# Patient Record
Sex: Male | Born: 1955 | Race: White | Hispanic: No | Smoking: Never smoker
Health system: Southern US, Community
[De-identification: ages and names within clinical notes are randomized; demographics above are authoritative.]

## PROBLEM LIST (undated history)

## (undated) DIAGNOSIS — D763 Other histiocytosis syndromes: Secondary | ICD-10-CM

## (undated) DIAGNOSIS — H3560 Retinal hemorrhage, unspecified eye: Secondary | ICD-10-CM

## (undated) DIAGNOSIS — R911 Solitary pulmonary nodule: Secondary | ICD-10-CM

## (undated) DIAGNOSIS — I10 Essential (primary) hypertension: Secondary | ICD-10-CM

## (undated) DIAGNOSIS — D4989 Neoplasm of unspecified behavior of other specified sites: Secondary | ICD-10-CM

## (undated) DIAGNOSIS — I2510 Atherosclerotic heart disease of native coronary artery without angina pectoris: Secondary | ICD-10-CM

## (undated) HISTORY — PX: HAND SURGERY: SHX662

## (undated) HISTORY — PX: HERNIA REPAIR: SHX51

---

## 2010-02-01 ENCOUNTER — Encounter: Payer: Self-pay | Admitting: Cardiology

## 2010-03-07 ENCOUNTER — Ambulatory Visit: Payer: Self-pay | Admitting: Cardiology

## 2010-03-07 DIAGNOSIS — I10 Essential (primary) hypertension: Secondary | ICD-10-CM | POA: Insufficient documentation

## 2010-03-07 DIAGNOSIS — R0989 Other specified symptoms and signs involving the circulatory and respiratory systems: Secondary | ICD-10-CM

## 2010-03-07 DIAGNOSIS — R0609 Other forms of dyspnea: Secondary | ICD-10-CM

## 2010-04-02 ENCOUNTER — Ambulatory Visit: Payer: Self-pay | Admitting: Cardiology

## 2010-04-05 ENCOUNTER — Ambulatory Visit: Payer: Self-pay

## 2010-04-05 ENCOUNTER — Ambulatory Visit (HOSPITAL_COMMUNITY): Admission: RE | Admit: 2010-04-05 | Discharge: 2010-04-05 | Payer: Self-pay | Admitting: Cardiology

## 2010-04-05 ENCOUNTER — Encounter: Payer: Self-pay | Admitting: Cardiology

## 2010-04-05 ENCOUNTER — Emergency Department (HOSPITAL_COMMUNITY): Admission: EM | Admit: 2010-04-05 | Discharge: 2010-04-05 | Payer: Self-pay | Admitting: Emergency Medicine

## 2010-04-05 ENCOUNTER — Ambulatory Visit: Payer: Self-pay | Admitting: Internal Medicine

## 2010-04-09 ENCOUNTER — Ambulatory Visit: Payer: Self-pay | Admitting: Cardiology

## 2010-04-09 DIAGNOSIS — R9430 Abnormal result of cardiovascular function study, unspecified: Secondary | ICD-10-CM | POA: Insufficient documentation

## 2010-04-09 LAB — CONVERTED CEMR LAB
Creatinine, Ser: 0.9 mg/dL (ref 0.4–1.5)
GFR calc non Af Amer: 93.02 mL/min (ref >60)
Glucose, Bld: 86 mg/dL (ref 70–99)

## 2010-04-10 ENCOUNTER — Encounter (INDEPENDENT_AMBULATORY_CARE_PROVIDER_SITE_OTHER): Payer: Self-pay | Admitting: *Deleted

## 2010-04-12 LAB — CONVERTED CEMR LAB
Ferritin: 79 ng/mL (ref 22–322)
Iron: 75 ug/dL (ref 42–165)

## 2010-04-15 LAB — CONVERTED CEMR LAB
BUN: 15 mg/dL (ref 6–23)
Calcium: 8.9 mg/dL (ref 8.4–10.5)
Chloride: 109 meq/L (ref 96–112)
Creatinine, Ser: 0.7 mg/dL (ref 0.4–1.5)
Eosinophils Relative: 0.8 % (ref 0.0–5.0)
GFR calc non Af Amer: 118.08 mL/min (ref >60)
Glucose, Bld: 87 mg/dL (ref 70–99)
Lymphocytes Relative: 19.9 % (ref 12.0–46.0)
Lymphs Abs: 1.5 10*3/uL (ref 0.7–4.0)
MCHC: 34.8 g/dL (ref 30.0–36.0)
MCV: 88.8 fL (ref 78.0–100.0)
Monocytes Absolute: 0.5 10*3/uL (ref 0.1–1.0)
Monocytes Relative: 6.5 % (ref 3.0–12.0)
Neutro Abs: 5.6 10*3/uL (ref 1.4–7.7)
Neutrophils Relative %: 72.3 % (ref 43.0–77.0)
Platelets: 170 10*3/uL (ref 150.0–400.0)
Sodium: 141 meq/L (ref 135–145)
WBC: 7.7 10*3/uL (ref 4.5–10.5)

## 2010-04-16 ENCOUNTER — Ambulatory Visit (HOSPITAL_COMMUNITY): Admission: RE | Admit: 2010-04-16 | Discharge: 2010-04-16 | Payer: Self-pay | Admitting: Cardiology

## 2010-04-16 ENCOUNTER — Ambulatory Visit: Payer: Self-pay | Admitting: Cardiology

## 2010-04-16 ENCOUNTER — Telehealth: Payer: Self-pay | Admitting: Cardiology

## 2010-04-17 ENCOUNTER — Ambulatory Visit: Payer: Self-pay | Admitting: Cardiology

## 2010-04-17 ENCOUNTER — Ambulatory Visit (HOSPITAL_COMMUNITY): Admission: RE | Admit: 2010-04-17 | Discharge: 2010-04-17 | Payer: Self-pay | Admitting: Cardiology

## 2010-04-19 ENCOUNTER — Telehealth: Payer: Self-pay | Admitting: Cardiology

## 2010-04-23 DIAGNOSIS — J984 Other disorders of lung: Secondary | ICD-10-CM

## 2010-04-25 ENCOUNTER — Telehealth: Payer: Self-pay | Admitting: Cardiology

## 2010-04-25 ENCOUNTER — Ambulatory Visit: Payer: Self-pay | Admitting: Internal Medicine

## 2010-04-26 ENCOUNTER — Encounter (INDEPENDENT_AMBULATORY_CARE_PROVIDER_SITE_OTHER): Payer: Self-pay | Admitting: *Deleted

## 2010-04-29 ENCOUNTER — Telehealth: Payer: Self-pay | Admitting: Cardiology

## 2010-05-01 ENCOUNTER — Telehealth: Payer: Self-pay | Admitting: Cardiology

## 2010-05-13 ENCOUNTER — Telehealth: Payer: Self-pay | Admitting: Cardiology

## 2010-11-05 NOTE — Progress Notes (Signed)
Summary: followup for pulmonary nodule  ---- Converted from flag ---- ---- 04/26/2010 4:59 PM, Merita Norton Lloyd-Fate wrote: Dr. Arbutus Ped referred Mr. Richard Knox to Dr. Solon Augusta for Pulmonary work up on 05-10-10 @ 1:50pm. After his appt. with Dr. Solon Augusta, his office will call him with an appt. pt aware. Merita Norton Lloyd-Fate  April 26, 2010 4:58 PM ------------------------------

## 2010-11-05 NOTE — Progress Notes (Signed)
Summary: pt has  medication question  Phone Note Call from Patient Call back at Home Phone [phone]   Caller: Patient Reason for Call: Talk to Nurse, Talk to Doctor Summary of Call: pt needs to know when to take his metoprolol prior to MRI testing Initial call taken by: Omer Jack,  April 16, 2010 2:57 PM  Follow-up for Phone Call        [facility] Katina Dung, RN, BSN  April 16, 2010 3:11 PM per Dr Almon Hercules to take Metoprolol 25mg  11pm tonight and metorpolol 50mg  tomorrow at 12 noon--pt aware by telephone

## 2010-11-05 NOTE — Progress Notes (Signed)
Summary: refill request  Phone Note Refill Request   Refills Requested: Medication #1:  LISINOPRIL 20 MG TABS one tablet daily [facility] [phone]   Method Requested: Telephone to Pharmacy Initial call taken by: Glynda Jaeger,  May 01, 2010 2:23 PM Caller: Patient Reason for Call: Talk to Nurse Summary of Call: pt requesting a refill of a med we didn't prescribe   Follow-up for Phone Call       Follow-up by: Judithe Modest CMA,  May 01, 2010 4:50 PM    Prescriptions: LISINOPRIL 20 MG TABS (LISINOPRIL) one tablet daily  #30 x 11   Entered by:   Judithe Modest CMA   Authorized by:   Marca Ancona, MD   Signed by:   Judithe Modest CMA on 05/01/2010   Method used:   Electronically to        [facility]. #[id number]* (retail)       [address] [place], [place]  [id number]       Ph: [phone]       Fax: [phone]   RxID:   [id number]

## 2010-11-05 NOTE — Letter (Signed)
Summary: Generic Letter  [facility], Main Office  [address]   [place], [place] [address]   Phone: [phone]  Fax: [phone]        April 26, 2010 MRN: [id number]    Richard Knox [address] [place], [place]  [address]    Dear [name],  Dr.  Dr. Shirline Frees has referred to Dr. Quitman Livings on 05-10-10 @ 1:50pm.After your appointment with Dr.Bynum, Dr. Shirline Frees office will call you with an appointment.   Dr.Bynum office is located at [address] 2nd floor.  [place], [place] [address] Telephone # is [phone]  Feel free to call me if you need to reschedule your appointment.        Sincerely,  Connye Burkitt [phone])  This letter has been electronically signed by your physician.

## 2010-11-05 NOTE — Progress Notes (Signed)
Summary: RX for Lipitor & carvedilol  Phone Note Other Incoming   Caller: Dr Shirlee Latch Details for Reason: New orders Summary of Call: Per telephone call from Dr Shirlee Latch:  pt needs to be started on Coreg 6.25 mg tablets to take 1/2 tablet two times a day for one week and then increase to 1 tablet two times a day thereafter.  He is also to be started on Lipitor 20 or Crestor 10mg  (depending on his insurance) and have a fasting lipid and liver profile in 2 months.  Left message for pt to call for orders. Initial call taken by: Charolotte Capuchin, RN,  April 19, 2010 9:54 AM  Follow-up for Phone Call        Pt returning call Judie Grieve  April 19, 2010 9:58 AM  pt aware and requested rx be send into the [facility] [address] in [place].  He is aware he needs fasting lab in 2 months however an appointment was not given as pt was on a "conference call" Follow-up by: Charolotte Capuchin, RN,  April 19, 2010 10:20 AM    New/Updated Medications: LIPITOR 20 MG TABS (ATORVASTATIN CALCIUM) one daily CARVEDILOL 6.25 MG TABS (CARVEDILOL) take 1/2 tablet two times a day for one week and then increase to 1 tablet two times a day Prescriptions: CARVEDILOL 6.25 MG TABS (CARVEDILOL) take 1/2 tablet two times a day for one week and then increase to 1 tablet two times a day  #60 x 11   Entered by:   Charolotte Capuchin, RN   Authorized by:   Marca Ancona, MD   Signed by:   Charolotte Capuchin, RN on 04/19/2010   Method used:   Electronically to        [facility]. #[id number]* (retail)       [address], [place], [place]  [id number]       Ph: [phone]       Fax: [phone]   RxID:   [id number] LIPITOR 20 MG TABS (ATORVASTATIN CALCIUM) one daily  #30 x 11   Entered by:   Charolotte Capuchin, RN   Authorized by:   Marca Ancona, MD   Signed by:   Charolotte Capuchin, RN on 04/19/2010   Method used:   Electronically to        [facility]. #[id number]* (retail)       [address] [place], [place]  [address]       Ph: [phone]       Fax: [phone]   RxID:   [id number]

## 2010-11-05 NOTE — Assessment & Plan Note (Signed)
Summary: NP6/TO GET ESTABLISHED/CARDIAC EVAL/EVAL FOR ECHO/JML   Visit Type:  Initial Consult Referring Provider:  Dr Abel Presto Primary Provider:  [facility]  CC:  cardiac check up.  History of Present Illness: 55 Knox with history of HTN presents for cardiac evaluation.  Several months ago, he had an episode of transient visual disturbance that sounds like it may have been due to a vascular ischemic event.  At the time, he was also having exertional dyspnea and chest tightness.  He thinks that this could have been related to stress as it has completely resolved after starting lisinopril for his blood pressure and getting out of a difficult relationship.  He is currently doing well with no exertional dyspnea or chest pain.  He walks briskly for several miles 3-4 times a week for exercise.  His optometrist suggested that he have a cardiac evaluation given the episode of visual disturbance.  Richard Knox does have a family history of early CAD: father developed angina in his 35s and later had stents placed.    ECG: NSR, normal  Current Medications (verified): 1)  Voltaren 75mg  .... 1 Tab As Directed For Joint Pain 2)  Lisinopril 10 Mg Tabs (Lisinopril) .... Take One Tablet By Mouth Daily  Allergies (verified): 1)  ! Sulfa  Past History:  Past Medical History: 1. HTN 2. Transient vIsual disturbance: ? ischemic vascular event.   Family History: Father with angina starting in his 34s, ended up getting stents.  Mother with PCM  Social History: Single (divorced).  Lives in [place].  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  Occasional social ETOH.   Review of Systems       All systems reviewed and negative except as per HPI.   Vital Signs:  Richard Knox profile:   55 year old male Height:      75 inches Weight:      248 pounds BMI:     31.11 Pulse rate:   72 / minute BP sitting:   141 / 88  (left arm) Cuff size:   regular  Vitals Entered By: Burnett Kanaris, CNA (March 07, 2010 11:40  AM)  Physical Exam  General:  Well developed, well nourished, in no acute distress. Head:  normocephalic and atraumatic Nose:  no deformity, discharge, inflammation, or lesions Mouth:  Teeth, gums and palate normal. Oral mucosa normal. Neck:  Neck supple, no JVD. No masses, thyromegaly or abnormal cervical nodes. Lungs:  Clear bilaterally to auscultation and percussion. Heart:  Non-displaced PMI, chest non-tender; regular rate and rhythm, S1, S2 without murmurs, rubs or gallops. Carotid upstroke normal, no bruit.  Pedals normal pulses. No edema, no varicosities. Abdomen:  Bowel sounds positive; abdomen soft and non-tender without masses, organomegaly, or hernias noted. No hepatosplenomegaly. Msk:  Back normal, normal gait. Muscle strength and tone normal. Extremities:  No clubbing or cyanosis. Neurologic:  Alert and oriented x 3. Skin:  Intact without lesions or rashes. Psych:  Normal affect.   Impression & Recommendations:  Problem # 1:  EXERTIONAL DYSPNEA/CHEST PAIN Richard Knox had a visual disturbance several months ago that his optometrist is concerned could be due to vascular disease.  He also, several months ago, had both exertional dyspnea and exertional chest pain.  This has completely resolved now.  Richard Knox does have risk factors: family history of premature CAD and HTN.  I will set him up for carotid dopplers (? embolic event to retinal arteries) as well as a stress echocardiogram.  I will contact his PCP's  office to get a copy of recent lipids.  Continue ASA 81 mg daily.   Problem # 2:  HYPERTENSION, UNSPECIFIED (ICD-401.9) BP is running high. I will increase lisinopril to 20 mg daily with BMET on the day he comes in for echo and carotid dopplers.   Other Orders: Carotid Duplex (Carotid Duplex) Echocardiogram (Echo) Stress Echo (Stress Echo)  Richard Knox Instructions: 1)  Your physician has recommended you make the following change in your medication:  2)  Increase Lisinopril to  20mg  daily. 3)  Your physician has requested that you have a stress echocardiogram. For further information please visit [email or web address]  Please follow instruction sheet as given. 4)  Your physician has requested that you have an echocardiogram.   5)  Echocardiography is a painless test that uses sound waves to create images of your heart. It provides your doctor with information about the size and shape of your heart and how well your heart's chambers and valves are working.  This procedure takes approximately one hour. There are no restrictions for this procedure. 6)  Your physician has requested that you have a carotid duplex. This test is an ultrasound of the carotid arteries in your neck. It looks at blood flow through these arteries that supply the brain with blood. Allow one hour for this exam. There are no restrictions or special instructions. 7)  Lab in 10 days---BMP 786.09--you can have this done the same day you have the other testing done.  8)  Your physician recommends that you schedule a follow-up appointment as needed with Dr Shirlee Latch. Prescriptions: LISINOPRIL 20 MG TABS (LISINOPRIL) one tablet daily  #30 x 11   Entered by:   Katina Dung, RN, BSN   Authorized by:   Marca Ancona, MD   Signed by:   Katina Dung, RN, BSN on 03/07/2010   Method used:   Electronically to        [facility]. #[id number]* (retail)       [address] [place], [place]  [id number]       Ph: [phone]       Fax: [phone]   RxID:   [id number]

## 2010-11-05 NOTE — Letter (Signed)
Summary: Appointment - Cardiac MRI  [facility]     [place], [place]    Phone:   Fax:       April 10, 2010 MRN: [id number]   Richard Knox [address] [place], [place]  [address]   Dear [name],   We have scheduled the above patient for an appointment for a Cardiac MRI on July 13 at 1:00 p.m.  Please refer to the below information for the location and instructions for this test:  Location:     [facility]       [address]       [place], [place]  [address] Instructions:    Wilmon Arms at [facility] Outpatient Registration 45 minutes prior to your appointment time.  This will ensure you are in the Radiology Department 30 minutes prior to your appointment.    There are no restrictions for this test you may eat and take medications as usual.  If you need to reschedule this appointment please call at the number listed above.  Sincerely,      Lorne Skeens  [facility] Scheduling Team

## 2010-11-05 NOTE — Progress Notes (Signed)
Summary: second opinion  ---- Converted from flag ---- ---- 05/08/2010 9:35 AM, Connye Burkitt wrote: Thurston Hole, Mr. Dimartino has decided to get a second opinion with [facility] cardiology. He stated he will call us to when he decides what he will do after his office visit with [facility] cardiology. ------------------------------  Appended Document: second opinion That is fine.  We can send his records if he wants.

## 2010-11-05 NOTE — Letter (Signed)
Summary: Appointment - Cardiac MRI  [facility]     [place], [place]    Phone:   Fax:       April 10, 2010 MRN: [id number]   Richard Knox [address] [place], [place]  [address]   Dear [name],   We have scheduled the above patient for an appointment for a Cardiac MRI on July 12,2011  at  1:00 p.m.  Please refer to the below information for the location and instructions for this test:  Location:     [facility]       [address]       [place], [place]  [address] Instructions:    Wilmon Arms at [facility] Outpatient Registration 45 minutes prior to your appointment time.  This will ensure you are in the Radiology Department 30 minutes prior to your appointment.    There are no restrictions for this test you may eat and take medications as usual.  If you need to reschedule this appointment please call at the number listed above.  Sincerely,      Lorne Skeens  [facility] Scheduling Team

## 2010-11-05 NOTE — Progress Notes (Signed)
Summary: pt calling re side effects from med  Phone Note Call from Patient   Caller: Patient Reason for Call: Talk to Nurse Summary of Call: pt started lipitor on tuesday-having diarrhea and cramping, also started coreg and having very dry eyes, almost can't stand it-pls call [phone] Initial call taken by: Glynda Jaeger,  April 25, 2010 9:09 AM  Follow-up for Phone Call        pt has taken 2 doses of Lipitor--about 2 hours after taking Lipitor yesterday he had abdominal cramping and diarrhea--he states he had also eaten something about 2 hours before  he had the  abdominal cramping and diarrhea--very dry eyes and feels like  he has pepper in his eyes--he feels it is related to Coreg--he is not sure if he can continue the medication--I will review with Dr Gae Gallop, RN, BSN  April 25, 2010 10:18 AM   Additional Follow-up for Phone Call Additional follow up Details #1::        I reviewed with Dr Laron Angelini--Dr. Shirlee Latch recommended pt try medication for 1 week--if he did not feel he could tolerate Lipitor he will change to Pravachol 20mg  and can change Coreg to a different beta blocker--pt not satisfied--Dr Shirlee Latch will talk with pt today Katina Dung, RN, BSN  April 25, 2010 1:51 PM Dr Shirlee Latch talked with pt--he will continue meds for now

## 2010-11-05 NOTE — Assessment & Plan Note (Signed)
Summary: Richard Knox   Visit Type:  Follow-up Referring Provider:  Dr Abel Presto Primary Provider:  [facility]  CC:  Test results.  History of Present Illness: 55 yo with history of HTN returns for cardiac evaluation.  Several months ago, he had an episode of transient visual disturbance that sounds like it may have been due to a vascular ischemic event.  At the time, he was also having some mild dyspnea and atypical chest tightness.  He thinks that this could have been related to stress as it has completely resolved after starting lisinopril for his blood pressure and getting out of a difficult relationship.  He is currently doing well with no exertional dyspnea or chest pain.  He walks briskly for several miles 3-4 times a week for exercise.  His optometrist suggested that he have a cardiac evaluation given the episode of visual disturbance.  Patient does have a family history of early CAD: father developed angina in his 53s and later had stents placed.    I had him do a stress echo.  This was read as showing EF 35-40% with mid-apical lateral, inferior and inferior hypokinesis.  The stress portion showed good exercise tolerance without definite worsening of the baseline wall motion abnormalities (though they did not improve).  He had no chest pain with exertion. I reviewed the echo myself today and would estimate the EF more in the range of 45% with mild global hypokinesis but still abnormal.   Labs (4/11): LDL 100.6, HDL 36, K 4, creatinine 0.7  Current Medications (verified): 1)  Voltaren 75mg  .... 1 Tab As Directed For Joint Pain 2)  Lisinopril 20 Mg Tabs (Lisinopril) .... One Tablet Daily 3)  Aspirin 81 Mg Chew (Aspirin) .... One Tablet Daily 4)  Cialis 5 Mg Tabs (Tadalafil) .... Take As Directed  Allergies: 1)  ! Sulfa  Past History:  Past Medical History: 1. HTN 2. Transient vIsual disturbance: ? ischemic vascular event.  3. Stress echo (6/11): Mildly dilated LV, EF 35-40% with  mid to apical inferior, anterolateral, and posterior hypokinesis.  No worsening of WMAs with exercise but also no improvement.  Good exercise tolerance with no chest pain. I reviewed the study myself and estimate EF to be more in the line of 45% with more global hypokinesis.    Family History: Father with angina starting in his 53s, ended up getting stents.  Mother with PCM, hemochromatosis  Social History: Reviewed history from 04/09/2010 and no changes required. Single (divorced).  Lives in [place].  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  Occasional social ETOH.   Review of Systems       All systems reviewed and negative except as per HPI.   Vital Signs:  Patient profile:   55 year old male Height:      75 inches Weight:      247 pounds BMI:     30.98 Pulse rate:   68 / minute Pulse rhythm:   regular Resp:     18 per minute BP sitting:   124 / 84  (left arm) Cuff size:   large  Vitals Entered By: Vikki Ports (April 09, 2010 3:27 PM)  Physical Exam  General:  Well developed, well nourished, in no acute distress. Neck:  Neck supple, no JVD. No masses, thyromegaly or abnormal cervical nodes. Lungs:  Clear bilaterally to auscultation and percussion. Heart:  Non-displaced PMI, chest non-tender; regular rate and rhythm, S1, S2 without murmurs, rubs or gallops. Carotid upstroke  normal, no bruit.  Pedals normal pulses. No edema, no varicosities. Abdomen:  Bowel sounds positive; abdomen soft and non-tender without masses, organomegaly, or hernias noted. No hepatosplenomegaly. Extremities:  No clubbing or cyanosis. Neurologic:  Alert and oriented x 3. Psych:  Normal affect.   Impression & Recommendations:  Problem # 1:  CARDIOVASCULAR FUNCTION STUDY, ABNORMAL (ICD-794.30) Patient had an abnormal stress echo with baseline depressed systolic function.  I reviewed the echo myself and LV EF appears to be around 45% with more diffuse hypokinesis.  Patient has actually had no chest pain  or dyspnea-type symptoms for a number of months.  He did have some chest pain and dyspnea prior that were atypical and may have been related to a stressful relationship.  He is nearly asymptomatic now with excellent exercise tolerance.  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  He is not a heavy drinker.  He does not use drugs like cocaine and amphetamine.  - CAD needs to be ruled out.  I discussed options for this with him, and we decided to do a coronary CT angiogram to assess for obstructive CAD.    - Patient's mother has hemochromatosis.  If he also has this disorder, it could potentially cause a cardiomyopathy.  I will get Fe, ferritin, CBC, and TIBC today.   - In addition to the coronary CT angiogram, I am going to set him up for a cardiac MRI to assess for infiltrative disease, evidence of myocarditis, or a CAD scar pattern.  - He is on an ACEI, and if low EF is confirmed by MRI, will need to start beta blocker.   Problem # 2:  HYPERTENSION, UNSPECIFIED (ICD-401.9) BP is now well-controlled.   Other Orders: TLB-BMP (Basic Metabolic Panel-BMET) (80048-METABOL) TLB-CBC Platelet - w/Differential (85025-CBCD) T-Ferritin ([address]) Augusto Gamble [phone]) T-Iron Binding Capacity (TIBC) ([address]) Cardiac CTA (Cardiac CTA) Cardiac MRI (Cardiac MRI)  Patient Instructions: 1)  Your physician recommends that you have lab today--BMP/CBC/Iron/TIBC/Ferritin 2)  Your physician has requested that you have a cardiac CT.  Cardiac computed tomography (CT) is a painless test that uses an x-ray machine to take clear, detailed pictures of your heart.  For further information please visit [email or web address]  Please follow instruction sheet as given. TAKE METOPROLOL 25mg  the night before the test and METOPROLOL  50mg  (two 25mg  ) the morning of the test. 3)  Take Valium 2.5mg  (one-half of a 5mg  tablet) 30-60 minutes before the test. 4)  Your physician has requested that you have a cardiac  MRI.  Cardiac MRI uses a computer to create images of your heart as it's beating, producing both still and moving pictures of your heart and major blood vessels. For further information please visit  [email or web address]  Please follow the instruction sheet given to you today for more information. 5)  Your physician recommends that you schedule a follow-up appointment with Dr Shirlee Latch about 2 weeks after the tests have been done. Prescriptions: VALIUM 5 MG TABS (DIAZEPAM) one- half tablet 30-60 minutes before the test  #1 x 0   Entered by:   Katina Dung, RN, BSN   Authorized by:   Marca Ancona, MD   Signed by:   Katina Dung, RN, BSN on 04/09/2010   Method used:   Print then Give to Patient   RxID:   [id number] METOPROLOL TARTRATE 25 MG TABS (METOPROLOL TARTRATE) ONE  tablet the night before the Cardiac CTA and TWO tablets the morning of the Cardaic CTA  #3 x  0   Entered by:   Katina Dung, RN, BSN   Authorized by:   Marca Ancona, MD   Signed by:   Katina Dung, RN, BSN on 04/09/2010   Method used:   Electronically to        [facility]. #[id number]* (retail)       [address] [place], [place]  [address]       Ph: [phone]       Fax: [phone]   RxID:   [id number]

## 2010-11-08 NOTE — Consult Note (Signed)
Summary: [facility] Refered Appointment  Reminder  [facility] Refered Appointment  Reminder   Imported By: Roderic Ovens 03/15/2010 14:42:50  _____________________________________________________________________  External Attachment:    Type:   Image     Comment:   External Document

## 2010-12-22 LAB — RPR: RPR Ser Ql: NONREACTIVE

## 2010-12-22 LAB — GC/CHLAMYDIA PROBE AMP, GENITAL: GC Probe Amp, Genital: NEGATIVE

## 2011-09-24 ENCOUNTER — Other Ambulatory Visit: Payer: Self-pay | Admitting: Cardiology

## 2011-11-06 ENCOUNTER — Encounter (HOSPITAL_COMMUNITY): Payer: Self-pay

## 2011-11-06 ENCOUNTER — Ambulatory Visit (HOSPITAL_COMMUNITY)
Admission: RE | Admit: 2011-11-06 | Discharge: 2011-11-06 | Disposition: A | Discharge status: Home / Self Care | Payer: BC Managed Care – HMO | Source: Ambulatory Visit | Attending: Internal Medicine | Admitting: Internal Medicine

## 2011-11-06 ENCOUNTER — Other Ambulatory Visit: Payer: Self-pay

## 2011-11-06 VITALS — BP 118/70 | HR 74 | Wt 244.8 lb

## 2011-11-06 DIAGNOSIS — E785 Hyperlipidemia, unspecified: Secondary | ICD-10-CM | POA: Insufficient documentation

## 2011-11-06 DIAGNOSIS — I2510 Atherosclerotic heart disease of native coronary artery without angina pectoris: Secondary | ICD-10-CM

## 2011-11-06 DIAGNOSIS — R0989 Other specified symptoms and signs involving the circulatory and respiratory systems: Secondary | ICD-10-CM | POA: Insufficient documentation

## 2011-11-06 DIAGNOSIS — R0609 Other forms of dyspnea: Secondary | ICD-10-CM | POA: Insufficient documentation

## 2011-11-06 HISTORY — DX: Solitary pulmonary nodule: R91.1

## 2011-11-06 HISTORY — DX: Neoplasm of unspecified behavior of other specified sites: D49.89

## 2011-11-06 HISTORY — DX: Atherosclerotic heart disease of native coronary artery without angina pectoris: I25.10

## 2011-11-06 HISTORY — DX: Other histiocytosis syndromes: D76.3

## 2011-11-06 HISTORY — DX: Retinal hemorrhage, unspecified eye: H35.60

## 2011-11-06 HISTORY — DX: Essential (primary) hypertension: I10

## 2011-11-06 LAB — COMPREHENSIVE METABOLIC PANEL
Albumin: 3.9 g/dL (ref 3.5–5.2)
Alkaline Phosphatase: 47 U/L (ref 39–117)
Creatinine, Ser: 0.78 mg/dL (ref 0.50–1.35)
GFR calc non Af Amer: >90 mL/min (ref >90)
Glucose, Bld: 97 mg/dL (ref 70–99)
Total Bilirubin: 0.6 mg/dL (ref 0.3–1.2)

## 2011-11-06 LAB — LIPID PANEL
Cholesterol: 119 mg/dL (ref 0–200)
Triglycerides: 104 mg/dL (ref <150)
VLDL: 21 mg/dL (ref 0–40)

## 2011-11-06 NOTE — Progress Notes (Signed)
HPI:  Richard Knox is a 56 y/o male with h/o HTN, retinal hemorrhage, OSA (uses dental appliance) CAD s/p DES to mLAD x 2 in 11/11. Presents to establish new patient evaluation.  Had problems with his vision at then end of 2011. Went to ophthalmologist who found retinal hemorrhage. Underwent carotid u/s which was normal. Had stress echo. EF found to be 45%.    Had cardiac MRI showed EF 45% without scar. Cardiac CT 50% LAD and mild plaque otherwise. Then went to [facility] and had cath with LAD 85% and underwent stenting.  Was unable to tolerate carvedilol due to fatigue. Now on metoprolol. Echo last year with EF 53% at [facility].  Overall feels pretty good. Very active without CP or dyspnea. No edema, orthopnea or PND. Still on Plavix and statin. Bruises with Plavix. Has stopped Plavix for 2 weeks in the past.   Review of Systems:     Cardiac Review of Systems: {Y] = yes [ ]  = no  Chest Pain [    ]  Resting SOB [   ] Exertional SOB  [  ]  Orthopnea [  ]   Pedal Edema [   ]    Palpitations [  ] Syncope  [  ]   Presyncope [   ]  General Review of Systems: [Y] = yes [  ]=no Constitional: recent weight change [  ]; anorexia [  ]; fatigue [  ]; nausea [  ]; night sweats [  ]; fever [  ]; or chills [  ];                                                                                                                                          Eye : blurred vision [  ]; diplopia [   ]; vision changes [  ];  Amaurosis fugax[  ]; Resp: cough [  ];  wheezing[  ];  hemoptysis[  ]; shortness of breath[  ]; paroxysmal nocturnal dyspnea[  ]; dyspnea on exertion[  ]; or orthopnea[  ];  GI:  gallstones[  ], vomiting[  ];  dysphagia[  ]; melena[  ];  hematochezia [  ]; heartburn[  ];   Hx of  Colonoscopy[  ]; GU: kidney stones [  ]; hematuria[  ];   dysuria [  ];  nocturia[  ];  history of     obstruction [  ];                 Skin: rash, swelling[  ];, hair loss[  ];  peripheral edema[  ];  or itching[  ]; Musculosketetal: myalgias[   ];  joint swelling[  ];  joint erythema[  ];  joint pain[  ];  back pain[  ];  Heme/Lymph: bruising[  ];  bleeding[  ];  anemia[  ];  Neuro: TIA[  ];  headaches[  ];  stroke[  ];  vertigo[  ];  seizures[  ];   paresthesias[  ];  difficulty walking[  ];  Psych:depression[  ]; anxiety[  ];  Endocrine: diabetes[  ];  thyroid dysfunction[  ];  Immunizations: Flu [  ]; Pneumococcal[  ];  Other:    Past Medical History  Diagnosis Date  . HTN (hypertension)   . CAD (coronary artery disease)     s/p XIENCE DES x 2 mLAD in Nov 2011 ([facility])  . Retinal hemorrhage   . 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 Pulmonary nodule     Current Outpatient Prescriptions  Medication Sig Dispense Refill  . aspirin 81 MG tablet Take 81 mg by mouth daily.      Marland Kitchen atorvastatin (LIPITOR) 20 MG tablet TAKE 1 TABLET DAILY  90 tablet  0  . B Complex-C (B-COMPLEX WITH VITAMIN C) tablet Take 1 tablet by mouth daily.      . cetirizine (ZYRTEC) 10 MG tablet Take 10 mg by mouth daily.      . clopidogrel (PLAVIX) 75 MG tablet Take 75 mg by mouth daily.      . fish oil-omega-3 fatty acids 1000 MG capsule Take 1,200 mg by mouth daily.      Marland Kitchen lisinopril (PRINIVIL,ZESTRIL) 20 MG tablet Take 20 mg by mouth daily.      . metoprolol tartrate (LOPRESSOR) 25 MG tablet Take 25 mg by mouth once.      . multivitamin-iron-minerals-folic acid (CENTRUM) chewable tablet Chew 1 tablet by mouth daily.      . tadalafil (CIALIS) 5 MG tablet Take 5 mg by mouth daily as needed.         Allergies  Allergen Reactions  . Sulfonamide Derivatives     History   Social History  . Marital Status: Divorced    Spouse Name: N/A    Number of Children: N/A  . Years of Education: N/A   Occupational History  . Not on file.   Social History Main Topics  . Smoking status: Never Smoker   . Smokeless tobacco: Never Used  . Alcohol Use: Yes  . Drug Use: Not on file  . Sexually Active: Not on file   Other Topics Concern    . Not on file   Social History Narrative  . No narrative on file    No family history on file.  PHYSICAL EXAM: Filed Vitals:   11/06/11 0916  BP: 118/70  Pulse: 74   General:  Well appearing. No respiratory difficulty HEENT: normal Neck: supple. no JVD. Carotids 2+ bilat; no bruits. No lymphadenopathy or thryomegaly appreciated. Cor: PMI nondisplaced. Regular rate & rhythm. No rubs, gallops or murmurs. Lungs: clear Abdomen: soft, nontender, nondistended. No hepatosplenomegaly. No bruits or masses. Good bowel sounds. Extremities: no cyanosis, clubbing, rash, edema Neuro: alert & oriented x 3, cranial nerves grossly intact. moves all 4 extremities w/o difficulty. Affect pleasant.  ECG: NSR 67 No ST-T wave abnormalities.     ASSESSMENT & PLAN:  Lipids goal LDL < 70 Echo ecg

## 2011-11-06 NOTE — Patient Instructions (Signed)
Labs today  Your physician has requested that you have an echocardiogram. Echocardiography is a painless test that uses sound waves to create images of your heart. It provides your doctor with information about the size and shape of your heart and how well your heart's chambers and valves are working. This procedure takes approximately one hour. There are no restrictions for this procedure.  We will contact you in 4 months to schedule your next appointment.  

## 2011-11-09 DIAGNOSIS — E785 Hyperlipidemia, unspecified: Secondary | ICD-10-CM | POA: Insufficient documentation

## 2011-11-09 DIAGNOSIS — I2510 Atherosclerotic heart disease of native coronary artery without angina pectoris: Secondary | ICD-10-CM | POA: Insufficient documentation

## 2011-11-09 NOTE — Assessment & Plan Note (Addendum)
No evidence of ischemia. Continue current regimen. Lengthy discussion about the pros/cons of stopping plavix including small risk of late-stent thrombosis. He will weigh this against the risk of bleeding he has in his active lifestyle and severe bruising he has been having. Will get echo to reassess LV function.

## 2011-11-09 NOTE — Assessment & Plan Note (Signed)
Check lipids and CMET. Increase statin as needed to get LDL < 70.

## 2011-11-27 ENCOUNTER — Encounter (HOSPITAL_COMMUNITY): Payer: Self-pay | Admitting: *Deleted

## 2011-12-08 ENCOUNTER — Ambulatory Visit (HOSPITAL_COMMUNITY)
Admission: RE | Admit: 2011-12-08 | Discharge: 2011-12-08 | Disposition: A | Discharge status: Home / Self Care | Payer: BC Managed Care – HMO | Source: Ambulatory Visit | Attending: Internal Medicine | Admitting: Internal Medicine

## 2011-12-08 DIAGNOSIS — I379 Nonrheumatic pulmonary valve disorder, unspecified: Secondary | ICD-10-CM | POA: Insufficient documentation

## 2011-12-08 DIAGNOSIS — I517 Cardiomegaly: Secondary | ICD-10-CM

## 2011-12-08 DIAGNOSIS — I10 Essential (primary) hypertension: Secondary | ICD-10-CM | POA: Insufficient documentation

## 2011-12-08 DIAGNOSIS — R0609 Other forms of dyspnea: Secondary | ICD-10-CM | POA: Insufficient documentation

## 2011-12-08 DIAGNOSIS — R0989 Other specified symptoms and signs involving the circulatory and respiratory systems: Secondary | ICD-10-CM | POA: Insufficient documentation

## 2011-12-08 NOTE — Progress Notes (Signed)
  Echocardiogram 2D Echocardiogram has been performed.  Richard Knox 12/08/2011, 3:51 PM

## 2011-12-16 ENCOUNTER — Other Ambulatory Visit: Payer: Self-pay | Admitting: Cardiology

## 2012-03-04 ENCOUNTER — Encounter (HOSPITAL_COMMUNITY): Payer: BC Managed Care – HMO

## 2012-03-12 ENCOUNTER — Ambulatory Visit (HOSPITAL_COMMUNITY)
Admission: RE | Admit: 2012-03-12 | Discharge: 2012-03-12 | Disposition: A | Discharge status: Home / Self Care | Payer: BC Managed Care – HMO | Source: Ambulatory Visit | Attending: Internal Medicine | Admitting: Internal Medicine

## 2012-03-12 ENCOUNTER — Encounter (HOSPITAL_COMMUNITY): Payer: Self-pay

## 2012-03-12 VITALS — BP 130/80 | HR 64 | Ht 75.0 in | Wt 251.8 lb

## 2012-03-12 DIAGNOSIS — H3560 Retinal hemorrhage, unspecified eye: Secondary | ICD-10-CM | POA: Insufficient documentation

## 2012-03-12 DIAGNOSIS — I2510 Atherosclerotic heart disease of native coronary artery without angina pectoris: Secondary | ICD-10-CM

## 2012-03-12 DIAGNOSIS — E785 Hyperlipidemia, unspecified: Secondary | ICD-10-CM | POA: Insufficient documentation

## 2012-03-12 DIAGNOSIS — I10 Essential (primary) hypertension: Secondary | ICD-10-CM | POA: Insufficient documentation

## 2012-03-12 DIAGNOSIS — G4733 Obstructive sleep apnea (adult) (pediatric): Secondary | ICD-10-CM | POA: Insufficient documentation

## 2012-03-12 DIAGNOSIS — Z7982 Long term (current) use of aspirin: Secondary | ICD-10-CM | POA: Insufficient documentation

## 2012-03-12 MED ORDER — ASPIRIN 81 MG PO TABS: 162.0000 mg | ORAL_TABLET | Freq: Every day | ORAL | Status: AC

## 2012-03-12 NOTE — Progress Notes (Signed)
Patient ID: Richard Knox, male   DOB: [birth year], 56 y.o.   MRN: [id number] HPI:  Richard Knox is a 56 y/o male with h/o HTN, retinal hemorrhage, OSA (uses dental appliance) CAD s/p DES to mLAD x 2 in 11/11. Presents to establish new patient evaluation.  Had problems with his vision at then end of 2011. Went to ophthalmologist who found retinal hemorrhage. Underwent carotid u/s which was normal. Had stress echo. EF found to be 45%.    Had cardiac MRI showed EF 45% without scar. Cardiac CT 50% LAD and mild plaque otherwise. Then went to [facility] and had cath with LAD 85% and underwent stenting.  Was unable to tolerate carvedilol due to fatigue. Now on metoprolol. Echo last year with EF 53% at [facility]. Echo in 3/13: EF 45-50%  Overall feels pretty good. Active without CP or dyspnea. No edema, orthopnea or PND. Still on Plavix and statin. Continues with heavy bruising with Plavix. Has stopped Plavix for 2 weeks in the past without problem. Complains of problems with short-term memory. Wonders if it is statin.   Lab Results  Component Value Date   CHOL 119 11/06/2011   HDL 39* 11/06/2011   LDLCALC 59 11/06/2011   TRIG 104 11/06/2011   CHOLHDL 3.1 11/06/2011     Review of Systems:     Cardiac Review of Systems: {Y] = yes [ ]  = no  Chest Pain [    ]  Resting SOB [   ] Exertional SOB  [  ]  Orthopnea [  ]   Pedal Edema [   ]    Palpitations [  ] Syncope  [  ]   Presyncope [   ]  General Review of Systems: [Y] = yes [  ]=no Constitional: recent weight change [  ]; anorexia [  ]; fatigue [  ]; nausea [  ]; night sweats [  ]; fever [  ]; or chills [  ];                                                                                                                                          Eye : blurred vision [  ]; diplopia [   ]; vision changes [  ];  Amaurosis fugax[  ]; Resp: cough [  ];  wheezing[  ];  hemoptysis[  ]; shortness of breath[  ]; paroxysmal nocturnal dyspnea[  ]; dyspnea on exertion[  ]; or orthopnea[  ];    GI:  gallstones[  ], vomiting[  ];  dysphagia[  ]; melena[  ];  hematochezia [  ]; heartburn[  ];   Hx of  Colonoscopy[  ]; GU: kidney stones [  ]; hematuria[  ];   dysuria [  ];  nocturia[  ];  history of     obstruction [  ];  Skin: rash, swelling[  ];, hair loss[  ];  peripheral edema[  ];  or itching[  ]; Musculosketetal: myalgias[  ];  joint swelling[  ];  joint erythema[  ];  joint pain[  ];  back pain[  ];  Heme/Lymph: bruising[  ];  bleeding[  ];  anemia[  ];  Neuro: TIA[  ];  headaches[  ];  stroke[  ];  vertigo[  ];  seizures[  ];   paresthesias[  ];  difficulty walking[  ];  Psych:depression[  ]; anxiety[  ];  Endocrine: diabetes[  ];  thyroid dysfunction[  ];  Immunizations: Flu [  ]; Pneumococcal[  ];  Other:    Past Medical History  Diagnosis Date  . HTN (hypertension)   . CAD (coronary artery disease)     s/p XIENCE DES x 2 mLAD in Nov 2011 ([facility])  . Retinal hemorrhage   . 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 Pulmonary nodule     Current Outpatient Prescriptions  Medication Sig Dispense Refill  . aspirin 81 MG tablet Take 81 mg by mouth daily.      Marland Kitchen atorvastatin (LIPITOR) 20 MG tablet TAKE 1 TABLET DAILY (NEED FOLLOW UP VISIT AND LAB WORK)  90 tablet  0  . B Complex-C (B-COMPLEX WITH VITAMIN C) tablet Take 1 tablet by mouth daily.      . cetirizine (ZYRTEC) 10 MG tablet Take 10 mg by mouth daily.      . clopidogrel (PLAVIX) 75 MG tablet Take 75 mg by mouth daily.      . fish oil-omega-3 fatty acids 1000 MG capsule Take 1,200 mg by mouth daily.      Marland Kitchen lisinopril (PRINIVIL,ZESTRIL) 20 MG tablet Take 20 mg by mouth daily.      . metoprolol tartrate (LOPRESSOR) 25 MG tablet Take 25 mg by mouth once.      . Multiple Vitamins-Minerals (CENTRUM SILVER ULTRA MENS PO) Take 1,200 Units by mouth daily.      . tadalafil (CIALIS) 5 MG tablet Take 5 mg by mouth daily as needed.      . vitamin E 400 UNIT capsule Take 400 Units by mouth daily.          Allergies  Allergen Reactions  . Sulfonamide Derivatives     History   Social History  . Marital Status: Divorced    Spouse Name: N/A    Number of Children: N/A  . Years of Education: N/A   Occupational History  . Not on file.   Social History Main Topics  . Smoking status: Never Smoker   . Smokeless tobacco: Never Used  . Alcohol Use: Yes  . Drug Use: Not on file  . Sexually Active: Not on file   Other Topics Concern  . Not on file   Social History Narrative  . No narrative on file    No family history on file.  PHYSICAL EXAM: Filed Vitals:   03/12/12 0908  BP: 130/80  Pulse: 64   General:  Well appearing. No respiratory difficulty HEENT: normal Neck: supple. no JVD. Carotids 2+ bilat; no bruits. No lymphadenopathy or thryomegaly appreciated. Cor: PMI nondisplaced. Regular rate & rhythm. No rubs, gallops or murmurs. Lungs: clear Abdomen: soft, nontender, nondistended. No hepatosplenomegaly. No bruits or masses. Good bowel sounds. Extremities: no cyanosis, clubbing, rash, edema. Large ecchymosis on R bicep.  Neuro: alert & oriented x 3, cranial nerves grossly intact. moves all 4 extremities  w/o difficulty. Affect pleasant.  ECG: NSR 67 No ST-T wave abnormalities.     ASSESSMENT & PLAN:

## 2012-03-12 NOTE — Patient Instructions (Signed)
Stop Plavix Increase Aspirin to 2 tabs daily (162 mg)  Hold Atorvastatin for 2 weeks  We will contact you in 6 months to schedule your next appointment.

## 2012-03-13 NOTE — Assessment & Plan Note (Signed)
He is concerned that atorvastatin may be causing problems with short-term memory loss. We will have a 2-3 week trial off of atorva and see if this makes a difference. If not, will restart. If he notices a difference may attempt to switch him to low-dose Crestor.

## 2012-03-13 NOTE — Assessment & Plan Note (Signed)
No evidence of ischemia. He is having extensive bruising on Plavix. He is more than 2 years out from his LAD stent. We have discussed the risks of stopping Plavix at length including late stent thrombosis Given bruising will stop Plavix and increase ASA to 162 daily.

## 2012-03-15 ENCOUNTER — Other Ambulatory Visit: Payer: Self-pay | Admitting: Internal Medicine

## 2012-05-07 ENCOUNTER — Telehealth (HOSPITAL_COMMUNITY): Payer: Self-pay | Admitting: Cardiology

## 2012-05-07 ENCOUNTER — Telehealth (HOSPITAL_COMMUNITY): Payer: Self-pay | Admitting: Vascular Surgery

## 2012-05-07 MED ORDER — ATORVASTATIN CALCIUM 20 MG PO TABS: 20.0000 mg | ORAL_TABLET | Freq: Every day | ORAL | Status: DC

## 2012-05-07 NOTE — Telephone Encounter (Signed)
Prescription sent into to [facility] on [address]. Confirmed with pharmacy.   Mr Mendez appreciated the return phone call.

## 2012-05-07 NOTE — Telephone Encounter (Signed)
Patient still has questions about his prescriptions.  He needs a short term prescription until he gets his mail order.  Please call patient. 

## 2012-05-07 NOTE — Telephone Encounter (Signed)
Pt called to request refill on Atorvstatin 20mg . Last filled in 12/2011 for a 90 day supply. Pt states pharm sent refill request sometime in June with no response. Please call into [facility]. Last OV 03/12/12 last FLP 11/06/11  Thanks

## 2012-05-07 NOTE — Telephone Encounter (Signed)
Script sent to pharm. Pt made aware.

## 2012-05-07 NOTE — Telephone Encounter (Signed)
Patient still has questions about his prescriptions.  He needs a short term prescription until he gets his mail order.  Please call patient.

## 2012-09-13 ENCOUNTER — Other Ambulatory Visit (HOSPITAL_COMMUNITY): Payer: Self-pay | Admitting: Cardiology

## 2012-09-13 DIAGNOSIS — I10 Essential (primary) hypertension: Secondary | ICD-10-CM

## 2012-09-13 MED ORDER — METOPROLOL TARTRATE 25 MG PO TABS: 25.0000 mg | ORAL_TABLET | Freq: Once | ORAL | Status: DC

## 2012-09-16 ENCOUNTER — Other Ambulatory Visit (HOSPITAL_COMMUNITY): Payer: Self-pay | Admitting: Cardiology

## 2012-09-16 NOTE — Telephone Encounter (Signed)
OPENED IN ERROR

## 2012-09-23 ENCOUNTER — Telehealth (HOSPITAL_COMMUNITY): Payer: Self-pay | Admitting: *Deleted

## 2012-09-23 DIAGNOSIS — I10 Essential (primary) hypertension: Secondary | ICD-10-CM

## 2012-09-23 MED ORDER — METOPROLOL TARTRATE 25 MG PO TABS: 25.0000 mg | ORAL_TABLET | Freq: Once | ORAL | Status: DC

## 2012-09-23 NOTE — Telephone Encounter (Signed)
Per pt he has still not received [facility] and they have contacted him, advised rx was sent electronically on 12/9, he is out so [facility], [facility] at [phone] and they state prescription is in process and will ship 12/24

## 2012-09-28 ENCOUNTER — Other Ambulatory Visit (HOSPITAL_COMMUNITY): Payer: Self-pay | Admitting: *Deleted

## 2012-09-28 ENCOUNTER — Other Ambulatory Visit (HOSPITAL_COMMUNITY): Payer: Self-pay | Admitting: Cardiology

## 2012-09-28 ENCOUNTER — Telehealth (HOSPITAL_COMMUNITY): Payer: Self-pay | Admitting: Cardiology

## 2012-09-28 DIAGNOSIS — I10 Essential (primary) hypertension: Secondary | ICD-10-CM

## 2012-09-28 MED ORDER — METOPROLOL SUCCINATE ER 25 MG PO TB24: 25.0000 mg | ORAL_TABLET | Freq: Every day | ORAL | Status: DC

## 2012-09-28 NOTE — Telephone Encounter (Signed)
Opened in error

## 2012-09-28 NOTE — Telephone Encounter (Signed)
Pt states his metoprolol is succinate and when he got hte refills it states tartrate, which is wrong.  He states he has been on the succinate since before he saw [place]

## 2012-11-10 ENCOUNTER — Other Ambulatory Visit (HOSPITAL_COMMUNITY): Payer: Self-pay | Admitting: Internal Medicine

## 2012-11-22 ENCOUNTER — Other Ambulatory Visit (HOSPITAL_COMMUNITY): Payer: Self-pay | Admitting: Cardiology

## 2012-11-22 DIAGNOSIS — I10 Essential (primary) hypertension: Secondary | ICD-10-CM

## 2012-11-22 MED ORDER — METOPROLOL SUCCINATE ER 25 MG PO TB24: 25.0000 mg | ORAL_TABLET | Freq: Every day | ORAL | Status: DC

## 2012-12-18 ENCOUNTER — Other Ambulatory Visit (HOSPITAL_COMMUNITY): Payer: Self-pay | Admitting: Internal Medicine

## 2013-03-08 ENCOUNTER — Other Ambulatory Visit (HOSPITAL_COMMUNITY): Payer: Self-pay | Admitting: *Deleted

## 2013-03-08 MED ORDER — LISINOPRIL 20 MG PO TABS: 20.0000 mg | ORAL_TABLET | Freq: Every day | ORAL | Status: DC

## 2013-04-10 ENCOUNTER — Other Ambulatory Visit (HOSPITAL_COMMUNITY): Payer: Self-pay | Admitting: Internal Medicine

## 2013-07-18 ENCOUNTER — Telehealth (HOSPITAL_COMMUNITY): Payer: Self-pay | Admitting: Cardiology

## 2013-07-18 DIAGNOSIS — J984 Other disorders of lung: Secondary | ICD-10-CM

## 2013-07-18 NOTE — Telephone Encounter (Signed)
Per dr bensimhon referral to [facility] pul[facility]-lung nodule

## 2013-08-30 ENCOUNTER — Encounter (INDEPENDENT_AMBULATORY_CARE_PROVIDER_SITE_OTHER): Payer: Self-pay

## 2013-08-30 ENCOUNTER — Encounter: Payer: Self-pay | Admitting: Pulmonary Disease

## 2013-08-30 ENCOUNTER — Ambulatory Visit (INDEPENDENT_AMBULATORY_CARE_PROVIDER_SITE_OTHER): Payer: BC Managed Care – PPO | Admitting: Pulmonary Disease

## 2013-08-30 VITALS — BP 126/86 | HR 68 | Temp 98.8°F | Ht 75.0 in | Wt 272.0 lb

## 2013-08-30 DIAGNOSIS — J984 Other disorders of lung: Secondary | ICD-10-CM

## 2013-08-30 DIAGNOSIS — R109 Unspecified abdominal pain: Secondary | ICD-10-CM

## 2013-08-30 DIAGNOSIS — R05 Cough: Secondary | ICD-10-CM

## 2013-08-30 DIAGNOSIS — G8929 Other chronic pain: Secondary | ICD-10-CM

## 2013-08-30 DIAGNOSIS — R918 Other nonspecific abnormal finding of lung field: Secondary | ICD-10-CM

## 2013-08-30 MED ORDER — ALBUTEROL SULFATE HFA 108 (90 BASE) MCG/ACT IN AERS: 2.0000 | INHALATION_SPRAY | Freq: Four times a day (QID) | RESPIRATORY_TRACT | Status: DC | PRN

## 2013-08-30 NOTE — Progress Notes (Deleted)
  Subjective:    Patient ID: Richard Knox, male    DOB: [birth year], 57 y.o.   MRN: [id number]  HPI    Review of Systems  Constitutional: Positive for unexpected weight change. Negative for fever, chills, diaphoresis, activity change, appetite change and fatigue.  HENT: Negative for congestion, dental problem, ear discharge, ear pain, facial swelling, hearing loss, mouth sores, nosebleeds, postnasal drip, rhinorrhea, sinus pressure, sneezing, sore throat, tinnitus, trouble swallowing and voice change.   Eyes: Negative for photophobia, discharge, itching and visual disturbance.  Respiratory: Positive for cough and shortness of breath. Negative for apnea, choking, chest tightness, wheezing and stridor.   Cardiovascular: Negative for chest pain, palpitations and leg swelling.  Gastrointestinal: Positive for abdominal pain. Negative for nausea, vomiting, constipation, blood in stool and abdominal distention.  Genitourinary: Negative for dysuria, urgency, frequency, hematuria, flank pain, decreased urine volume and difficulty urinating.  Musculoskeletal: Negative for arthralgias, back pain, gait problem, joint swelling, myalgias, neck pain and neck stiffness.  Skin: Negative for color change, pallor and rash.  Neurological: Negative for dizziness, tremors, seizures, syncope, speech difficulty, weakness, light-headedness, numbness and headaches.  Hematological: Negative for adenopathy. Does not bruise/bleed easily.  Psychiatric/Behavioral: Negative for confusion, sleep disturbance and agitation. The patient is not nervous/anxious.        Objective:   Physical Exam        Assessment & Plan:

## 2013-08-30 NOTE — Progress Notes (Signed)
Chief Complaint  Patient presents with  . Pulmonary Consult    referred by Dr. Delia Heady for Pulmonary Nodule    History of Present Illness: Richard Knox is a 57 y.o. male hx of histiocytoma and second hand tobacco exposure for evaluation of lung nodule.  He has history of lung nodules found incidentally on CT.  He was previously followed by pulmonologist in [facility].  He had last CT chest in 2012.  He has history of histiocytoma 40 years ago >> treated with resection and radiation.  He was concerned about these nodules, and wanted additional follow up.  As a result he was referred to pulmonary medicine.  He never smoked, but his ex wife smoked about 1 pack per day for 20 years.  There is no prior history of pneumonia or exposure to tuberculosis.  He works in Airline pilot for [employer], and denies occupational exposure.  He is from [place], and has lived in the [place] region for the past 30 years.  He was never in the [employer].  He denies animal exposures.  He gets a cough when he has respiratory infection.  He gets a tickle in his throat at times.  He has history of deviated nasal septum.  He uses zyrtec daily for allergies, and flonase as needed.  Tests: CT chest 04/17/10 >> 5.3 mm nodule RML  CT chest 11/20/10 >> 6 mm nodule RML CT chest 05/27/11 >> no change  Richard Knox  has a past medical history of HTN (hypertension); CAD (coronary artery disease); Retinal hemorrhage; Histiocytoma; and Pulmonary nodule.  Richard Knox  has past surgical history that includes Hand surgery and Hernia repair.  Prior to Admission medications   Medication Sig Start Date End Date Taking? Authorizing Provider  aspirin 81 MG tablet Take 2 tablets (162 mg total) by mouth daily. 03/12/12  Yes Bevelyn Buckles Bensimhon, MD  atorvastatin (LIPITOR) 20 MG tablet TAKE 1 TABLET DAILY 04/10/13  Yes Dolores Patty, MD  B Complex-C (B-COMPLEX WITH VITAMIN C) tablet Take 1 tablet by mouth daily.   Yes Historical Provider, MD   cetirizine (ZYRTEC) 10 MG tablet Take 10 mg by mouth daily.   Yes Historical Provider, MD  Coenzyme Q10 (CO Q 10) 10 MG CAPS Take 1 capsule by mouth daily.   Yes Historical Provider, MD  fish oil-omega-3 fatty acids 1000 MG capsule Take 1,200 mg by mouth daily.   Yes Historical Provider, MD  lisinopril (PRINIVIL,ZESTRIL) 20 MG tablet Take 1 tablet (20 mg total) by mouth daily. 03/08/13  Yes Dolores Patty, MD  metoprolol succinate (TOPROL-XL) 25 MG 24 hr tablet Take 1 tablet (25 mg total) by mouth daily. 11/22/12  Yes Dolores Patty, MD  Multiple Vitamins-Minerals (CENTRUM SILVER ULTRA MENS PO) Take 1,200 Units by mouth daily.   Yes Historical Provider, MD  omeprazole (PRILOSEC) 40 MG capsule Take 40 mg by mouth daily.   Yes Historical Provider, MD  tadalafil (CIALIS) 5 MG tablet Take 5 mg by mouth daily as needed.   Yes Historical Provider, MD    Allergies  Allergen Reactions  . Sulfonamide Derivatives     His family history is not on file.  He  reports that he has never smoked. He has never used smokeless tobacco. He reports that he drinks alcohol.  Review of Systems  Constitutional: Positive for unexpected weight change. Negative for fever, chills, diaphoresis, activity change, appetite change and fatigue.  HENT: Negative for congestion, dental problem, ear discharge, ear pain, facial swelling,  hearing loss, mouth sores, nosebleeds, postnasal drip, rhinorrhea, sinus pressure, sneezing, sore throat, tinnitus, trouble swallowing and voice change.   Eyes: Negative for photophobia, discharge, itching and visual disturbance.  Respiratory: Positive for cough and shortness of breath. Negative for apnea, choking, chest tightness, wheezing and stridor.   Cardiovascular: Negative for chest pain, palpitations and leg swelling.  Gastrointestinal: Positive for abdominal pain. Negative for nausea, vomiting, constipation, blood in stool and abdominal distention.  Genitourinary: Negative for  dysuria, urgency, frequency, hematuria, flank pain, decreased urine volume and difficulty urinating.  Musculoskeletal: Negative for arthralgias, back pain, gait problem, joint swelling, myalgias, neck pain and neck stiffness.  Skin: Negative for color change, pallor and rash.  Neurological: Negative for dizziness, tremors, seizures, syncope, speech difficulty, weakness, light-headedness, numbness and headaches.  Hematological: Negative for adenopathy. Does not bruise/bleed easily.  Psychiatric/Behavioral: Negative for confusion, sleep disturbance and agitation. The patient is not nervous/anxious.    Physical Exam:  General - No distress ENT - No sinus tenderness, no oral exudate, no LAN, no thyromegaly, TM clear, pupils equal/reactive Cardiac - s1s2 regular, no murmur, pulses symmetric Chest - No wheeze/rales/dullness, good air entry, normal respiratory excursion Back - No focal tenderness Abd - Soft, non-tender, no organomegaly, + bowel sounds Ext - No edema Neuro - Normal strength, cranial nerves intact Skin - No rashes Psych - Normal mood, and behavior  Lab Results  Component Value Date   WBC 7.7 04/09/2010   HGB 16.5 04/09/2010   HCT 47.5 04/09/2010   MCV 88.8 04/09/2010   PLT 170.0 04/09/2010    Lab Results  Component Value Date   CREATININE 0.78 11/06/2011   BUN 13 11/06/2011   NA 141 11/06/2011   K 4.1 11/06/2011   CL 104 11/06/2011   CO2 28 11/06/2011    Lab Results  Component Value Date   ALT 19 11/06/2011   AST 21 11/06/2011   ALKPHOS 47 11/06/2011   BILITOT 0.6 11/06/2011    Assessment/Plan:  Coralyn Helling, MD [facility] Pager:  [phone]

## 2013-08-30 NOTE — Patient Instructions (Signed)
Proair two puffs up to four times per day as needed for cough, wheeze, or chest congestion Will schedule breathing test (PFT) and call with results Will schedule CT chest, abdomen, and pelvis and call with results Try using nasal irrigation (salt water sinus rinse) before using flonase Will call to schedule follow up after reviewing above test results

## 2013-08-31 MED ORDER — ALBUTEROL SULFATE HFA 108 (90 BASE) MCG/ACT IN AERS: 2.0000 | INHALATION_SPRAY | Freq: Four times a day (QID) | RESPIRATORY_TRACT | Status: AC | PRN

## 2013-09-02 ENCOUNTER — Ambulatory Visit (INDEPENDENT_AMBULATORY_CARE_PROVIDER_SITE_OTHER)
Admission: RE | Admit: 2013-09-02 | Discharge: 2013-09-02 | Disposition: A | Discharge status: Home / Self Care | Payer: BC Managed Care – PPO | Source: Ambulatory Visit | Attending: Pulmonary Disease | Admitting: Pulmonary Disease

## 2013-09-02 DIAGNOSIS — G8929 Other chronic pain: Secondary | ICD-10-CM

## 2013-09-02 DIAGNOSIS — R918 Other nonspecific abnormal finding of lung field: Secondary | ICD-10-CM

## 2013-09-02 DIAGNOSIS — R109 Unspecified abdominal pain: Secondary | ICD-10-CM

## 2013-09-02 MED ORDER — IOHEXOL 300 MG/ML  SOLN
100.0000 mL | Freq: Once | INTRAMUSCULAR | Status: AC | PRN
  Administered 2013-09-02: 100 mL via INTRAVENOUS

## 2013-09-05 ENCOUNTER — Telehealth: Payer: Self-pay | Admitting: Pulmonary Disease

## 2013-09-05 DIAGNOSIS — R05 Cough: Secondary | ICD-10-CM | POA: Insufficient documentation

## 2013-09-05 DIAGNOSIS — G8929 Other chronic pain: Secondary | ICD-10-CM | POA: Insufficient documentation

## 2013-09-05 NOTE — Assessment & Plan Note (Signed)
Will arrange for CT chest with contrast to further assess >> will forward results to PCP once done.

## 2013-09-05 NOTE — Telephone Encounter (Signed)
09/02/2013   CLINICAL DATA:  Pulmonary nodule. Chronic abdominal pain. Three-week history of right upper quadrant pain under the ribs.   EXAM: CT CHEST, ABDOMEN, AND PELVIS WITH CONTRAST   TECHNIQUE: Multidetector CT imaging of the chest, abdomen and pelvis was performed following the standard protocol during bolus administration of intravenous contrast. CONTRAST:  OMNIPAQUE IOHEXOL 300 MG/ML  SOLN   COMPARISON:  [facility] CT chest exam dated 05/18/2011. Marland Kitchen   FINDINGS:  CT CHEST FINDINGS   There is no axillary lymphadenopathy. No mediastinal or hilar lymphadenopathy. Heart size is normal. No pericardial or pleural effusion. Coronary artery calcification is noted.  Lung windows show a 6 mm right lower lobe pulmonary nodule on image 30, stable. Previously described 3 mm subpleural nodule is again identified (image 37 of series 3 today) and is unchanged. The 3 mm nodule in the posterior right costophrenic sulcus described on the previous study is also unchanged on image 52 of series 3 today. No evidence for left-sided lung nodule. There is no pulmonary edema. No focal airspace consolidation.  Bone windows reveal no worrisome lytic or sclerotic osseous lesions.   CT ABDOMEN AND PELVIS FINDINGS   Low-density of the liver parenchyma is compatible with steatosis. No focal abnormality is seen in the liver or spleen. The stomach, pancreas, and right adrenal gland are normal. 8 mm posterior left adrenal nodule is evident. Large duodenum diverticulum noted.  Multiple calcified gallstones are noted, measuring up to 11 mm in diameter. No intra or extrahepatic biliary duct dilatation.  15 mm cyst identified in the lower pole the right kidney. Left kidney is unremarkable.  No abdominal aortic aneurysm. There is no free fluid or lymphadenopathy in the abdomen.  Imaging through the pelvis shows no free intraperitoneal fluid. There is no pelvic sidewall lymphadenopathy. The prostate gland is enlarged.  Postsurgical change in the right inguinal region suggests prior hernia repair. There is a left-sided groin hernia containing only fat.  Diverticular changes are seen in the left colon without diverticulitis. The terminal ileum is normal. The appendix is normal.  Bone windows reveal no worrisome lytic or sclerotic osseous lesions.   IMPRESSION:  1. 3 tiny right lung nodules are stable in the 2 year interval since the prior chest CT. As such, these are consistent with benign disease, likely related to scarring.  2. Cholelithiasis without gallbladder wall thickening or pericholecystic fluid. No biliary dilatation.  3. Normal pancreas.  4. No change in the tiny posterior left adrenal nodule, also consistent with a benign process. This had an average attenuation of -1 Hounsfield units on the prior study, consistent with benign adrenal adenoma.  5. Left groin hernia contains only fat. No edema or fluid within the hernia sac to suggest fatty incarceration. Electronically Signed   By: Kennith Center M.D.   On: 09/02/2013 14:49    Attempted to contact pt to discuss results.    Will have my nurse inform pt that lung nodules have been stable on CT chest since 2011 >> these are likely benign, and no additional follow up needed.  His CT abd/pelvis shows gallstones >> will forward results to his PCP.  Will call him back after review of PFT's and then decide when he needs additional pulmonary follow up.

## 2013-09-05 NOTE — Assessment & Plan Note (Signed)
He has history of histiocytoma.  He has second hand tobacco exposure.  He has history of pulmonary nodules.  Will arrange for CT chest to further assess stability of these lesions.

## 2013-09-05 NOTE — Assessment & Plan Note (Signed)
Will give trial of albuterol.  Will arrange for PFT's to further assess.  Advised he should d/w PCP and cardiology about whether ACE inhibitor use could be contributing to cough.  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

## 2013-09-06 NOTE — Telephone Encounter (Signed)
Pt is aware of results. Will contact us if he needs anything before his PFT. Nothing further was needed.

## 2013-09-27 ENCOUNTER — Other Ambulatory Visit (HOSPITAL_COMMUNITY): Payer: Self-pay | Admitting: Internal Medicine

## 2013-12-25 ENCOUNTER — Other Ambulatory Visit (HOSPITAL_COMMUNITY): Payer: Self-pay | Admitting: Internal Medicine

## 2014-04-20 IMAGING — CT CT CHEST W/ CM
2 of 5 series · 15 of 36 positions shown, 18 images · IV contrast (omnipaque)
Comparison: [REDACTED] CT chest exam dated 05/18/2011. .

CLINICAL DATA: Pulmonary nodule. Chronic abdominal pain. Three-week
history of right upper quadrant pain under the ribs.

EXAM:
CT CHEST, ABDOMEN, AND PELVIS WITH CONTRAST
TECHNIQUE: Multidetector CT imaging of the chest, abdomen and pelvis was
performed following the standard protocol during bolus
administration of intravenous contrast.
CONTRAST:  100mL OMNIPAQUE IOHEXOL 300 MG/ML  SOLN

[Series 2: cap with · axial · 0.83mm/px · z∈[-686,-60]mm · 12 of 143 slices shown, 15 images]
[im 9/143  mediastinal]
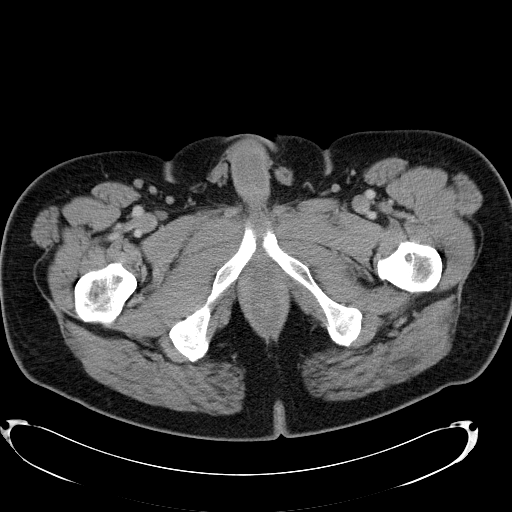
[im 9/143  lung]
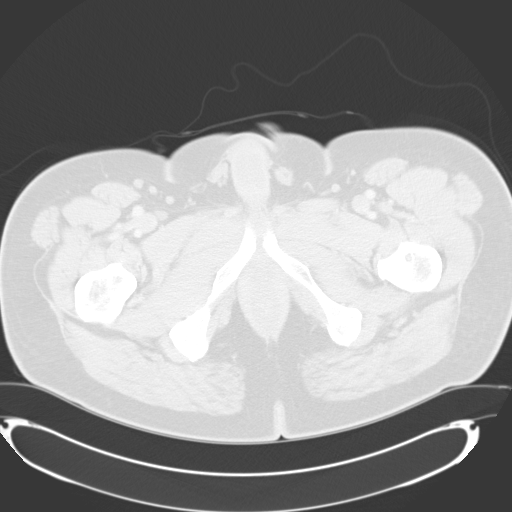
[im 26/143  lung]
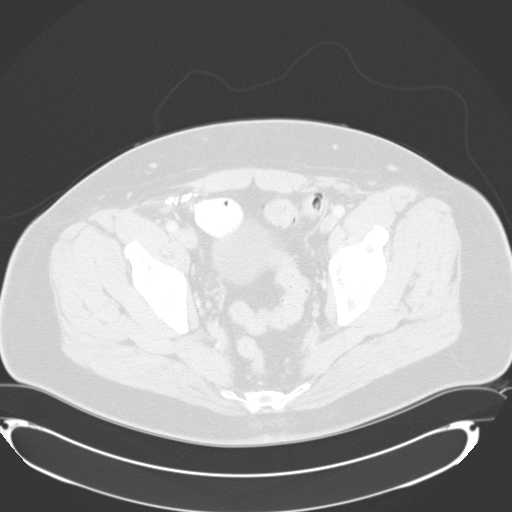
[im 34/143  lung]
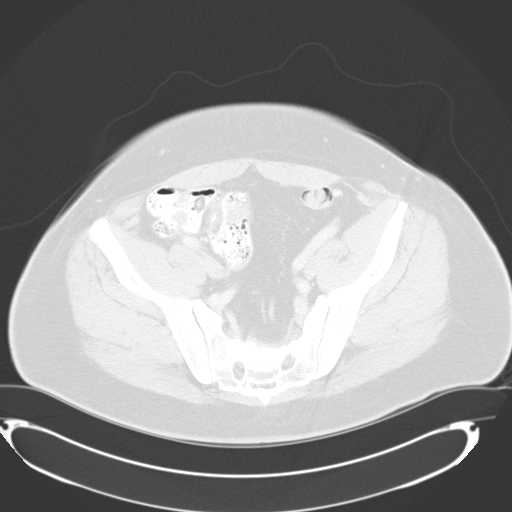
[im 42/143  lung]
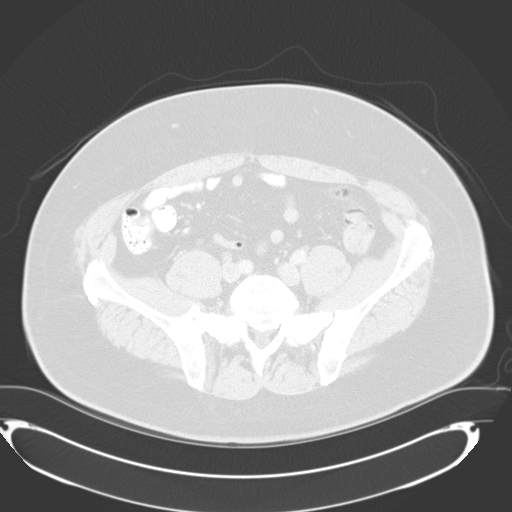
[im 59/143  mediastinal]
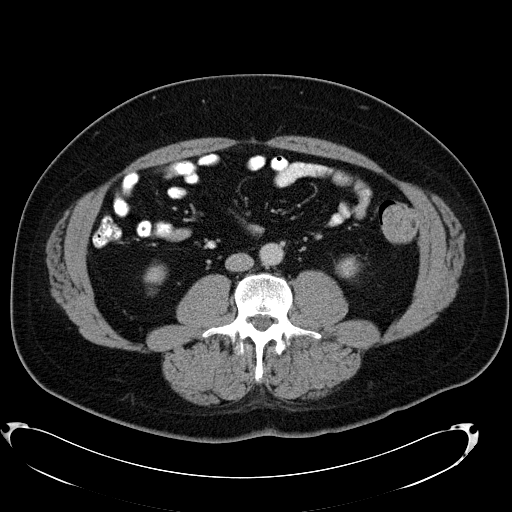
[im 59/143  lung]
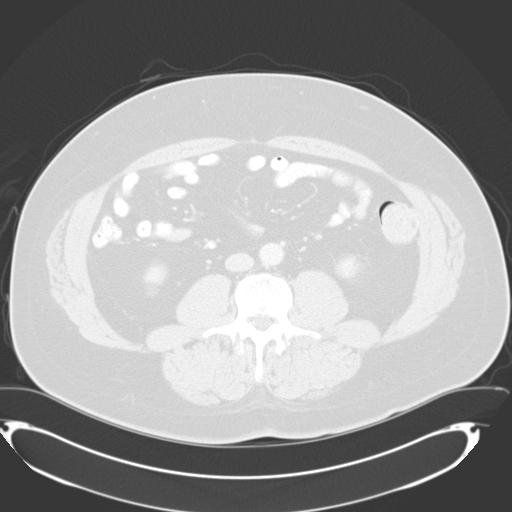
[im 67/143  lung]
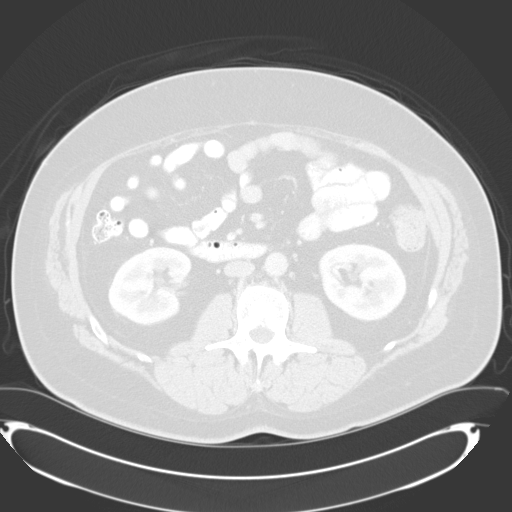
[im 76/143  lung]
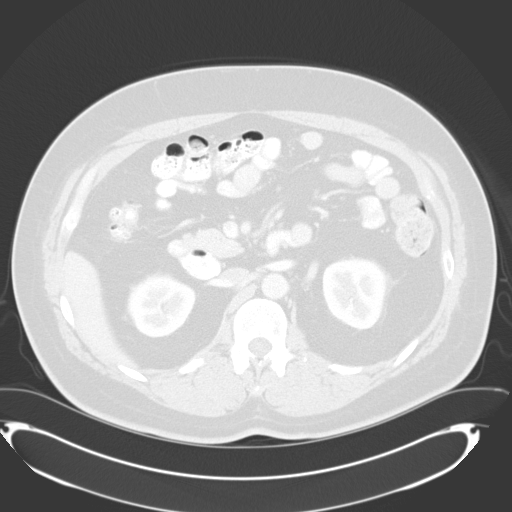
[im 92/143  lung]
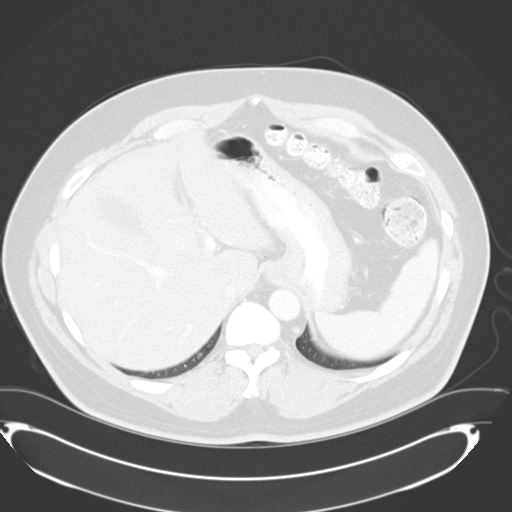
[im 101/143  mediastinal]
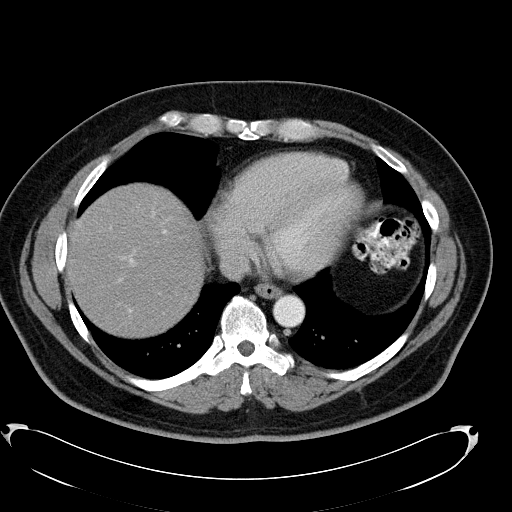
[im 101/143  lung]
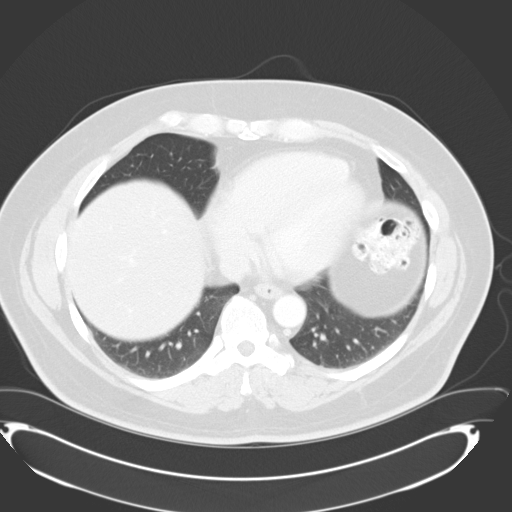
[im 109/143  lung]
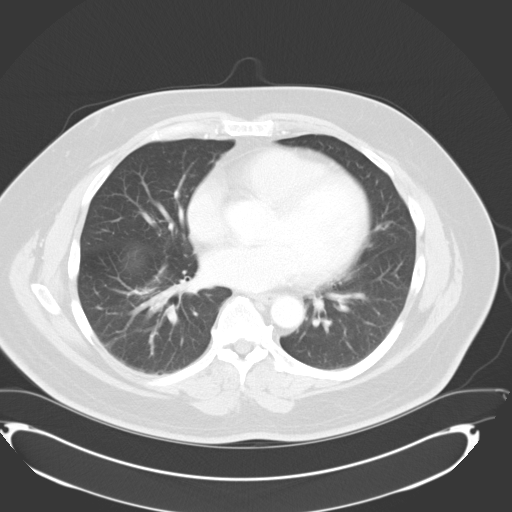
[im 126/143  lung]
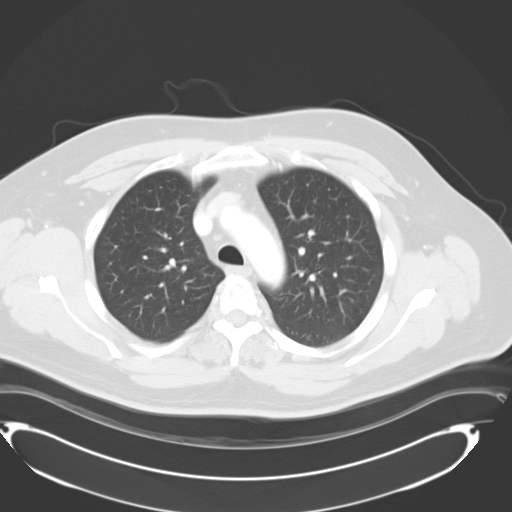
[im 134/143  lung]
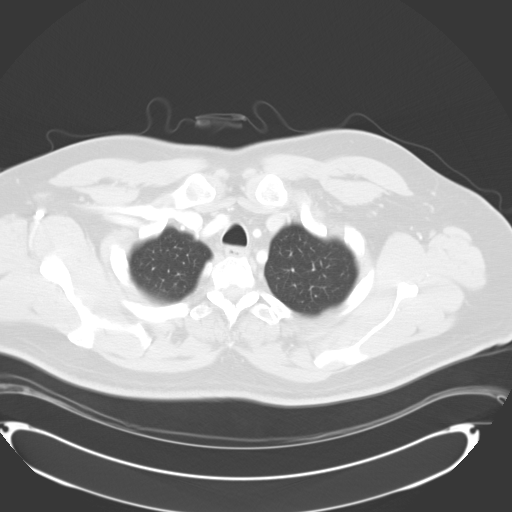

[Series 602: cor · coronal · 1.44mm/px · 3 of 141 slices shown]
[im 29/141  lung]
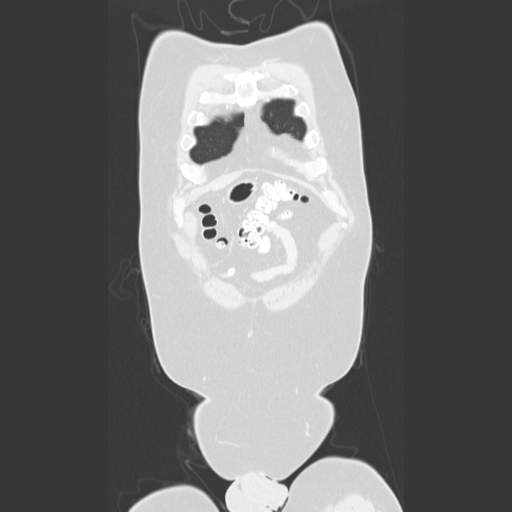
[im 57/141  lung]
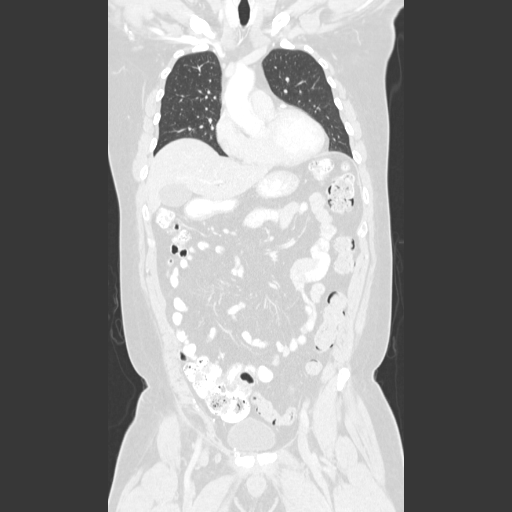
[im 85/141  lung]
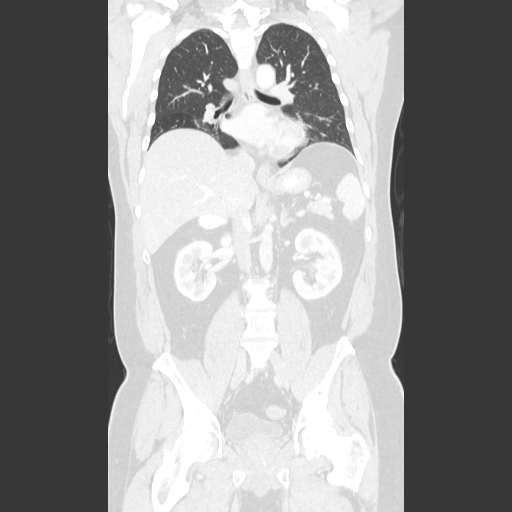

[15 of 36 positions shown; findings below may reference images not displayed]

FINDINGS: CT CHEST FINDINGS

There is no axillary lymphadenopathy. No mediastinal or hilar
lymphadenopathy. Heart size is normal. No pericardial or pleural
effusion. Coronary artery calcification is noted.

Lung windows show a 6 mm right lower lobe pulmonary nodule on image
30, stable. Previously described 3 mm subpleural nodule is again
identified (image 37 of series 3 today) and is unchanged. The 3 mm
nodule in the posterior right costophrenic sulcus described on the
previous study is also unchanged on image 52 of series 3 today. No
evidence for left-sided lung nodule. There is no pulmonary edema. No
focal airspace consolidation.

Bone windows reveal no worrisome lytic or sclerotic osseous lesions.

CT ABDOMEN AND PELVIS FINDINGS

Low-density of the liver parenchyma is compatible with steatosis. No
focal abnormality is seen in the liver or spleen. The stomach,
pancreas, and right adrenal gland are normal. 8 mm posterior left
adrenal nodule is evident. Large duodenum diverticulum noted.

Multiple calcified gallstones are noted, measuring up to 11 mm in
diameter. No intra or extrahepatic biliary duct dilatation.

15 mm cyst identified in the lower pole the right kidney. Left
kidney is unremarkable.

No abdominal aortic aneurysm. There is no free fluid or
lymphadenopathy in the abdomen.

Imaging through the pelvis shows no free intraperitoneal fluid.
There is no pelvic sidewall lymphadenopathy. The prostate gland is
enlarged. Postsurgical change in the right inguinal region suggests
prior hernia repair. There is a left-sided groin hernia containing
only fat.

Diverticular changes are seen in the left colon without
diverticulitis. The terminal ileum is normal. The appendix is
normal.

Bone windows reveal no worrisome lytic or sclerotic osseous lesions.
IMPRESSION: 1. 3 tiny right lung nodules are stable in the 2 year interval since
the prior chest CT. As such, these are consistent with benign
disease, likely related to scarring.
2. Cholelithiasis without gallbladder wall thickening or
pericholecystic fluid. No biliary dilatation.
3. Normal pancreas.
4. No change in the tiny posterior left adrenal nodule, also
consistent with a benign process. This had an average attenuation of
-1 Hounsfield units on the prior study, consistent with benign
adrenal adenoma.
5. Left groin hernia contains only fat. No edema or fluid within the
hernia sac to suggest fatty incarceration.

## 2014-05-15 ENCOUNTER — Other Ambulatory Visit (HOSPITAL_COMMUNITY): Payer: Self-pay | Admitting: Cardiology

## 2014-05-15 ENCOUNTER — Encounter (HOSPITAL_COMMUNITY): Payer: Self-pay | Admitting: Cardiology

## 2014-05-15 DIAGNOSIS — I10 Essential (primary) hypertension: Secondary | ICD-10-CM

## 2014-05-15 MED ORDER — LISINOPRIL 20 MG PO TABS: ORAL_TABLET | ORAL | Status: DC

## 2014-06-22 ENCOUNTER — Ambulatory Visit (HOSPITAL_COMMUNITY)
Admission: RE | Admit: 2014-06-22 | Discharge: 2014-06-22 | Disposition: A | Discharge status: Home / Self Care | Payer: 59 | Source: Ambulatory Visit | Attending: Internal Medicine | Admitting: Internal Medicine

## 2014-06-22 ENCOUNTER — Encounter (HOSPITAL_COMMUNITY): Payer: Self-pay

## 2014-06-22 VITALS — BP 128/86 | HR 78 | Wt 264.4 lb

## 2014-06-22 DIAGNOSIS — G4733 Obstructive sleep apnea (adult) (pediatric): Secondary | ICD-10-CM | POA: Insufficient documentation

## 2014-06-22 DIAGNOSIS — I509 Heart failure, unspecified: Secondary | ICD-10-CM | POA: Diagnosis not present

## 2014-06-22 DIAGNOSIS — Z79899 Other long term (current) drug therapy: Secondary | ICD-10-CM | POA: Diagnosis not present

## 2014-06-22 DIAGNOSIS — R911 Solitary pulmonary nodule: Secondary | ICD-10-CM | POA: Diagnosis not present

## 2014-06-22 DIAGNOSIS — I10 Essential (primary) hypertension: Secondary | ICD-10-CM | POA: Insufficient documentation

## 2014-06-22 DIAGNOSIS — H3560 Retinal hemorrhage, unspecified eye: Secondary | ICD-10-CM | POA: Insufficient documentation

## 2014-06-22 DIAGNOSIS — Z9889 Other specified postprocedural states: Secondary | ICD-10-CM | POA: Diagnosis not present

## 2014-06-22 DIAGNOSIS — R5381 Other malaise: Secondary | ICD-10-CM | POA: Diagnosis not present

## 2014-06-22 DIAGNOSIS — R5383 Other fatigue: Secondary | ICD-10-CM | POA: Diagnosis present

## 2014-06-22 DIAGNOSIS — D2360 Other benign neoplasm of skin of unspecified upper limb, including shoulder: Secondary | ICD-10-CM | POA: Diagnosis not present

## 2014-06-22 DIAGNOSIS — Z7982 Long term (current) use of aspirin: Secondary | ICD-10-CM | POA: Insufficient documentation

## 2014-06-22 DIAGNOSIS — E785 Hyperlipidemia, unspecified: Secondary | ICD-10-CM | POA: Diagnosis not present

## 2014-06-22 DIAGNOSIS — I5032 Chronic diastolic (congestive) heart failure: Secondary | ICD-10-CM | POA: Insufficient documentation

## 2014-06-22 DIAGNOSIS — I2510 Atherosclerotic heart disease of native coronary artery without angina pectoris: Secondary | ICD-10-CM | POA: Diagnosis present

## 2014-06-22 DIAGNOSIS — T733XXA Exhaustion due to excessive exertion, initial encounter: Secondary | ICD-10-CM

## 2014-06-22 MED ORDER — METOPROLOL SUCCINATE ER 25 MG PO TB24: ORAL_TABLET | ORAL | Status: DC

## 2014-06-22 MED ORDER — ATORVASTATIN CALCIUM 20 MG PO TABS: ORAL_TABLET | ORAL | Status: DC

## 2014-06-22 MED ORDER — LISINOPRIL 20 MG PO TABS: ORAL_TABLET | ORAL | Status: AC

## 2014-06-22 NOTE — Progress Notes (Signed)
Patient ID: Raahim Shartzer, male   DOB: [birth year], 58 y.o.   MRN: [id number] HPI:  Nehemiah is a 58 y/o male with h/o HTN, retinal hemorrhage, OSA (uses dental appliance) CAD s/p DES to mLAD x 2 in 11/11.   Had problems with his vision at then end of 2011. Went to ophthalmologist who found retinal hemorrhage. Underwent carotid u/s which was normal. Had stress echo. EF found to be 45%. Had cardiac MRI showed EF 45% without scar. Cardiac CT 50% LAD and mild plaque otherwise. Then went to [facility] and had cath with LAD 85% and underwent stenting.Was unable to tolerate carvedilol due to fatigue. Now on metoprolol. ECHO with EF 53% at [facility] at one point. Then subsequent Echo in 3/13: EF 45-50%  Presents today for f/up. Recently noted that he has felt better since dropping 10lbs. Active and denies CP/SOB. But wife notes that he is more fatigued of late. Stopped plavix at last visit (2013). Switched to ASA (was taking 325mg  by his own decision). No more episodes of dizziness. Had significant concerns regarding memory and statin use. Currently taking ator 20mg . Weights currently 260s. Systolic BP  ECHO 06/6788- EF 45-50%; grade II diastolic dysfunction  Lab Results  Component Value Date   CHOL 119 11/06/2011   HDL 39* 11/06/2011   LDLCALC 59 11/06/2011   TRIG 104 11/06/2011   CHOLHDL 3.1 11/06/2011      Past Medical History  Diagnosis Date  . HTN (hypertension)   . CAD (coronary artery disease)     s/p XIENCE DES x 2 mLAD in Nov 2011 ([facility])  . Retinal hemorrhage   . 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 Pulmonary nodule     Current Outpatient Prescriptions  Medication Sig Dispense Refill  . albuterol (PROAIR HFA) 108 (90 BASE) MCG/ACT inhaler Inhale 2 puffs into the lungs every 6 (six) hours as needed for wheezing or shortness of breath.  1 Inhaler  5  . aspirin 81 MG tablet Take 2 tablets (162 mg total) by mouth daily.  30 tablet    . atorvastatin (LIPITOR) 20 MG tablet TAKE 1 TABLET BY  MOUTH EVERY DAY  90 tablet  1  . B Complex-C (B-COMPLEX WITH VITAMIN C) tablet Take 1 tablet by mouth daily.      . cetirizine (ZYRTEC) 10 MG tablet Take 10 mg by mouth daily.      . Coenzyme Q10 (CO Q 10) 10 MG CAPS Take 1 capsule by mouth daily.      . fish oil-omega-3 fatty acids 1000 MG capsule Take 1,200 mg by mouth daily.      Marland Kitchen lisinopril (PRINIVIL,ZESTRIL) 20 MG tablet TAKE 1 TABLET BY MOUTH EVERY DAY  90 tablet  1  . metoprolol succinate (TOPROL-XL) 25 MG 24 hr tablet TAKE 1 TABLET BY MOUTH EVERY DAY  90 tablet  1  . Multiple Vitamins-Minerals (CENTRUM SILVER ULTRA MENS PO) Take 1,200 Units by mouth daily.      Marland Kitchen omeprazole (PRILOSEC) 40 MG capsule Take 40 mg by mouth daily.      . tadalafil (CIALIS) 5 MG tablet Take 5 mg by mouth daily as needed.       No current facility-administered medications for this encounter.    PHYSICAL EXAM: Filed Vitals:   06/22/14 1456  BP: 128/86  Pulse: 78   General:  Well appearing. No respiratory difficulty HEENT: normal Neck: supple. no JVD. Carotids 2+ bilat; no bruits. No lymphadenopathy  or thryomegaly appreciated. Cor: PMI nondisplaced. Regular rate & rhythm. No rubs, gallops or murmurs. Lungs: clear Abdomen: soft, nontender, nondistended. No hepatosplenomegaly. No bruits or masses. Good bowel sounds. Extremities: no cyanosis, clubbing, rash, edema. Large ecchymosis on R bicep.  Neuro: alert & oriented x 3, cranial nerves grossly intact. moves all 4 extremities w/o difficulty. Affect pleasant.  ECG (10/2011): NSR 67 No ST-T wave abnormalities.   ASSESSMENT & PLAN: 1. CAD- s/p LAD stent  -No evidence of ischemia but initial CAD presentation was also asx. Plan ETT -cont BB and asa -had worries abt memory issues with statin  -Had extensive bruising with plavix. Switched over to ASA 162 daily.   2. Diastolic CHF-  -volume status stable -currently on asa (counselled on decreasing to 162mg  given bleeding risk), lisinopril 20, toprolol 25  (unable to tolerate carvedilol 2/2 fatigue) -repeat echo as needed  3. HLD-  --continue atorva 20. Dose limited by memory issues.  -will give pt script for lipid panel and CMET -Goal LDL < 70. Can switch to Crestor as needed.   F/up in 6 months or sooner if abnormal stress   Deago Burruss,MD 4:00 PM

## 2014-06-22 NOTE — Patient Instructions (Signed)
Your physician has requested that you have an exercise tolerance test. For further information please visit [email or web address]. Please also follow instruction sheet, as given.  We will contact you in 6 months to schedule your next appointment.

## 2014-07-14 ENCOUNTER — Telehealth (HOSPITAL_COMMUNITY): Payer: Self-pay | Admitting: Cardiology

## 2014-07-14 NOTE — Telephone Encounter (Signed)
Pt scheduled for GXT on 07/20/14 cpt [id number] icd 10- CAD I25.10  With pts current insurance- [facility] Pre cert #[id number]

## 2014-07-18 ENCOUNTER — Telehealth (HOSPITAL_COMMUNITY): Payer: Self-pay

## 2014-07-18 NOTE — Telephone Encounter (Signed)
Encounter complete. 

## 2014-07-19 ENCOUNTER — Telehealth (HOSPITAL_COMMUNITY): Payer: Self-pay

## 2014-07-19 NOTE — Telephone Encounter (Signed)
Encounter complete. 

## 2014-07-20 ENCOUNTER — Ambulatory Visit (HOSPITAL_COMMUNITY)
Admission: RE | Admit: 2014-07-20 | Discharge: 2014-07-20 | Disposition: A | Discharge status: Home / Self Care | Payer: 59 | Source: Ambulatory Visit | Attending: Internal Medicine | Admitting: Internal Medicine

## 2014-07-20 DIAGNOSIS — I2510 Atherosclerotic heart disease of native coronary artery without angina pectoris: Secondary | ICD-10-CM

## 2014-07-20 DIAGNOSIS — R5383 Other fatigue: Secondary | ICD-10-CM | POA: Insufficient documentation

## 2014-07-20 DIAGNOSIS — T733XXA Exhaustion due to excessive exertion, initial encounter: Secondary | ICD-10-CM

## 2014-07-20 NOTE — Procedures (Signed)
Exercise Treadmill Test   Test  Exercise Tolerance Test Ordering MD: Glori Bickers, MD    Unique Test No: 1  Treadmill:  1  Indication for ETT: fatigue  Contraindication to ETT: No   Stress Modality: exercise - treadmill  Cardiac Imaging Performed: non   Protocol: standard Bruce - maximal  Max BP:  166/98  Max MPHR (bpm):  164 85% MPR (bpm):  139  MPHR obtained (bpm):  173 % MPHR obtained:  105  Reached 85% MPHR (min:sec):  7:40 Total Exercise Time (min-sec):  11  Workload in METS:  13.4 Borg Scale: 15  Reason ETT Terminated:  SOB and Fatigue    ST Segment Analysis At Rest: normal ST segments - no evidence of significant ST depression With Exercise: upsloping st segment depression  Other Information Arrhythmia:  No Angina during ETT:  absent (0) Quality of ETT:  diagnostic  ETT Interpretation:  normal - no evidence of ischemia by ST analysis  Comments: 1-2 mm upsloping ST segment depression inferolaterally Excellent exercise tolerance No chest pain Duke score +11  Pixie Casino, MD, [facility] Attending Cardiologist [place]

## 2015-03-16 ENCOUNTER — Other Ambulatory Visit (HOSPITAL_COMMUNITY): Payer: Self-pay | Admitting: Internal Medicine

## 2015-06-14 ENCOUNTER — Other Ambulatory Visit: Payer: Self-pay | Admitting: Internal Medicine

## 2015-06-23 ENCOUNTER — Other Ambulatory Visit: Payer: Self-pay | Admitting: Internal Medicine

## 2015-09-08 ENCOUNTER — Other Ambulatory Visit: Payer: Self-pay | Admitting: Internal Medicine

## 2015-09-09 ENCOUNTER — Other Ambulatory Visit: Payer: Self-pay | Admitting: Internal Medicine

## 2015-09-13 ENCOUNTER — Other Ambulatory Visit: Payer: Self-pay | Admitting: Internal Medicine

## 2015-12-01 ENCOUNTER — Other Ambulatory Visit: Payer: Self-pay | Admitting: Internal Medicine

## 2016-02-20 ENCOUNTER — Other Ambulatory Visit: Payer: Self-pay | Admitting: Internal Medicine

## 2016-06-01 ENCOUNTER — Other Ambulatory Visit: Payer: Self-pay | Admitting: Internal Medicine

## 2016-09-11 ENCOUNTER — Other Ambulatory Visit: Payer: Self-pay | Admitting: Internal Medicine
